# Patient Record
Sex: Male | Born: 1995 | Race: Black or African American | Hispanic: No | Marital: Single | Smoking: Current some day smoker
Health system: Southern US, Community
[De-identification: ages and names within clinical notes are randomized; demographics above are authoritative.]

---

## 2016-03-16 ENCOUNTER — Emergency Department (HOSPITAL_COMMUNITY)
Admission: EM | Admit: 2016-03-16 | Discharge: 2016-03-16 | Disposition: A | Discharge status: Home / Self Care | Payer: Self-pay | Attending: Emergency Medicine | Admitting: Emergency Medicine

## 2016-03-16 ENCOUNTER — Encounter (HOSPITAL_COMMUNITY): Payer: Self-pay

## 2016-03-16 ENCOUNTER — Emergency Department (HOSPITAL_COMMUNITY): Payer: Self-pay

## 2016-03-16 DIAGNOSIS — R0789 Other chest pain: Secondary | ICD-10-CM | POA: Insufficient documentation

## 2016-03-16 DIAGNOSIS — F17210 Nicotine dependence, cigarettes, uncomplicated: Secondary | ICD-10-CM | POA: Insufficient documentation

## 2016-03-16 DIAGNOSIS — J069 Acute upper respiratory infection, unspecified: Secondary | ICD-10-CM | POA: Insufficient documentation

## 2016-03-16 LAB — I-STAT TROPONIN, ED: Troponin i, poc: 0 ng/mL (ref 0.00–0.08)

## 2016-03-16 MED ORDER — BENZONATATE 100 MG PO CAPS
100.0000 mg | ORAL_CAPSULE | Freq: Once | ORAL | Status: AC
  Administered 2016-03-16: 100 mg via ORAL
  Filled 2016-03-16: qty 1

## 2016-03-16 MED ORDER — IBUPROFEN 800 MG PO TABS: 800.0000 mg | ORAL_TABLET | Freq: Three times a day (TID) | ORAL | Status: AC

## 2016-03-16 MED ORDER — BENZONATATE 100 MG PO CAPS: 100.0000 mg | ORAL_CAPSULE | Freq: Three times a day (TID) | ORAL | Status: AC | PRN

## 2016-03-16 MED ORDER — FLUTICASONE PROPIONATE 50 MCG/ACT NA SUSP: 2.0000 | Freq: Every day | NASAL | Status: AC

## 2016-03-16 MED ORDER — IBUPROFEN 800 MG PO TABS
800.0000 mg | ORAL_TABLET | Freq: Once | ORAL | Status: AC
  Administered 2016-03-16: 800 mg via ORAL
  Filled 2016-03-16: qty 1

## 2016-03-16 NOTE — ED Provider Notes (Signed)
CSN: [id number]     Arrival date & time 03/16/16  1354 History  By signing my name below, I, Soijett Blue, attest that this documentation has been prepared under the direction and in the presence of Cheri Fowler, PA-C Electronically Signed: Soijett Blue, ED Scribe. 03/16/2016. 2:56 PM.   Chief Complaint  Patient presents with  . Nasal Congestion  . Cough      The history is provided by the patient. No language interpreter was used.    Joel Diaz is a 20 y.o. male who presents to the Emergency Department complaining of nasal congestion onset 3 days. Denies sick contacts. He states that he is having associated symptoms of generalized left sided sharp CP with cough and neck movement, productive cough, chest tightness, chest congestion, post-nasal drip, subjective fever, SOB with movement only, and sore throat. He states that he has tried benadryl for the relief for his symptoms. He denies leg swelling, n/v, abdominal pain, diarrhea, ear pain, and any other symptoms. Denies recent surgery or PMHx of DVT/PE. Pt states that he smokes 4 cigarettes a week. He reports he does not have CP if sitting or not moving.  His CP is not exertional.  No family hx of SCD.    History reviewed. No pertinent past medical history. History reviewed. No pertinent past surgical history. History reviewed. No pertinent family history. Social History  Substance Use Topics  . Smoking status: Current Some Day Smoker -- 0.00 packs/day    Types: Cigarettes  . Smokeless tobacco: None  . Alcohol Use: No    Review of Systems  Constitutional: Positive for fever (subjective).  HENT: Positive for congestion, postnasal drip and sore throat. Negative for ear pain.   Respiratory: Positive for cough, chest tightness and shortness of breath.   Cardiovascular: Positive for chest pain. Negative for leg swelling.  Gastrointestinal: Negative for nausea, vomiting, abdominal pain and diarrhea.  All other systems reviewed and are  negative.   Allergies  Review of patient's allergies indicates not on file.  Home Medications   Prior to Admission medications   Not on File   BP 125/80 mmHg  Pulse 81  Temp(Src) 99.1 F (37.3 C) (Oral)  Resp 16  Ht  (1.803 m)  Wt 77.111 kg  BMI 23.72 kg/m2  SpO2 100% Physical Exam  Constitutional: He is oriented to person, place, and time. He appears well-developed and well-nourished.  Non-toxic appearance. He does not have a sickly appearance. He does not appear ill.  HENT:  Head: Normocephalic and atraumatic.  Right Ear: Tympanic membrane normal.  Left Ear: Tympanic membrane normal.  Nose: Nose normal.  Mouth/Throat: Uvula is midline, oropharynx is clear and moist and mucous membranes are normal. No oropharyngeal exudate, posterior oropharyngeal edema, posterior oropharyngeal erythema or tonsillar abscesses.  Eyes: Conjunctivae are normal. Pupils are equal, round, and reactive to light.  Neck: Normal range of motion. Neck supple. No JVD present.  Cardiovascular: Normal rate, regular rhythm and normal heart sounds.   No murmur heard. No unilateral LE edema  Pulmonary/Chest: Effort normal and breath sounds normal. No accessory muscle usage or stridor. No respiratory distress. He has no wheezes. He has no rhonchi. He has no rales. He exhibits tenderness.  Chest pain reproducible with palpation of sternum and left anterior chest.   Abdominal: Soft. Bowel sounds are normal. He exhibits no distension. There is no tenderness.  Musculoskeletal: Normal range of motion.  Lymphadenopathy:    He has no cervical adenopathy.  Neurological: He is  alert and oriented to person, place, and time.  Speech clear without dysarthria.  Skin: Skin is warm and dry.  Psychiatric: He has a normal mood and affect. His behavior is normal.  Nursing note and vitals reviewed.   ED Course  Procedures (including critical care time) DIAGNOSTIC STUDIES: Oxygen Saturation is 100% on RA, nl by my  interpretation.    COORDINATION OF CARE: 2:56 PM Discussed treatment plan with pt at bedside which includes CXR, EKG, labs, ibuprofen, tessalon perles and pt agreed to plan.    Labs Review Labs Reviewed  Rosezena Sensor-STAT TROPOININ, ED    Imaging Review Dg Chest 2 View  03/16/2016  CLINICAL DATA:  Nasal and chest congestion.  Cough EXAM: CHEST  2 VIEW COMPARISON:  None FINDINGS: The heart size and mediastinal contours are within normal limits. Both lungs are clear. The visualized skeletal structures are unremarkable. IMPRESSION: No active cardiopulmonary disease. Electronically Signed   By: Signa Kellaylor  Stroud M.D.   On: 03/16/2016 15:58   I have personally reviewed and evaluated these images and lab results as part of my medical decision-making.   EKG Interpretation None     ED ECG REPORT   Date: 03/16/2016  Rate: 78   Rhythm: normal sinus rhythm  QRS Axis: normal  Intervals: normal  ST/T Wave abnormalities: early repolarization  Conduction Disutrbances:none  Narrative Interpretation:   Old EKG Reviewed: none available  I have personally reviewed the EKG tracing and agree with the computerized printout as noted.  MDM   Final diagnoses:  Atypical chest pain  URI (upper respiratory infection)    Patient is to be discharged with recommendation to follow up with PCP in regards to today's hospital visit. LIkely URI.  Plan to discharge home with ibuprofen, flonase, and tessalon perles.  Chest pain is not likely of cardiac or pulmonary etiology d/t presentation, perc negative, VSS, no tracheal deviation, no JVD or new murmur, RRR, breath sounds equal bilaterally, EKG without acute abnormalities, negative troponin, and negative CXR.  CP has been constant x 3 days, negative troponin today, single troponin sufficient.  HEART score 2.  Return to the ED is CP becomes exertional, associated with diaphoresis or nausea, radiates to left jaw/arm, worsens or becomes concerning in any way. Pt appears  reliable for follow up and is agreeable to discharge.   I personally performed the services described in this documentation, which was scribed in my presence. The recorded information has been reviewed and is accurate.    Cheri FowlerKayla Loza Prell, PA-C 03/16/16 1610  Mancel BaleElliott Wentz, MD 03/17/16 1054

## 2016-03-16 NOTE — ED Notes (Signed)
Pt c/o nasal/chest congestion, cough, and chest tightness x 3 days.  Pain score 7/10.  Pt reports that chest pain increases w/ movement and coughing.  Pt reports taking OTC medication w/o relief.

## 2016-03-16 NOTE — Discharge Instructions (Signed)
Upper Respiratory Infection, Adult Most upper respiratory infections (URIs) are a viral infection of the air passages leading to the lungs. A URI affects the nose, throat, and upper air passages. The most common type of URI is nasopharyngitis and is typically referred to as "the common cold." URIs run their course and usually go away on their own. Most of the time, a URI does not require medical attention, but sometimes a bacterial infection in the upper airways can follow a viral infection. This is called a secondary infection. Sinus and middle ear infections are common types of secondary upper respiratory infections. Bacterial pneumonia can also complicate a URI. A URI can worsen asthma and chronic obstructive pulmonary disease (COPD). Sometimes, these complications can require emergency medical care and may be life threatening.  CAUSES Almost all URIs are caused by viruses. A virus is a type of germ and can spread from one person to another.  RISKS FACTORS You may be at risk for a URI if:   You smoke.   You have chronic heart or lung disease.  You have a weakened defense (immune) system.   You are very young or very old.   You have nasal allergies or asthma.  You work in crowded or poorly ventilated areas.  You work in health care facilities or schools. SIGNS AND SYMPTOMS  Symptoms typically develop 2-3 days after you come in contact with a cold virus. Most viral URIs last 7-10 days. However, viral URIs from the influenza virus (flu virus) can last 14-18 days and are typically more severe. Symptoms may include:   Runny or stuffy (congested) nose.   Sneezing.   Cough.   Sore throat.   Headache.   Fatigue.   Fever.   Loss of appetite.   Pain in your forehead, behind your eyes, and over your cheekbones (sinus pain).  Muscle aches.  DIAGNOSIS  Your health care provider may diagnose a URI by:  Physical exam.  Tests to check that your symptoms are not due to  another condition such as:  Strep throat.  Sinusitis.  Pneumonia.  Asthma. TREATMENT  A URI goes away on its own with time. It cannot be cured with medicines, but medicines may be prescribed or recommended to relieve symptoms. Medicines may help:  Reduce your fever.  Reduce your cough.  Relieve nasal congestion. HOME CARE INSTRUCTIONS   Take medicines only as directed by your health care provider.   Gargle warm saltwater or take cough drops to comfort your throat as directed by your health care provider.  Use a warm mist humidifier or inhale steam from a shower to increase air moisture. This may make it easier to breathe.  Drink enough fluid to keep your urine clear or pale yellow.   Eat soups and other clear broths and maintain good nutrition.   Rest as needed.   Return to work when your temperature has returned to normal or as your health care provider advises. You may need to stay home longer to avoid infecting others. You can also use a face mask and careful hand washing to prevent spread of the virus.  Increase the usage of your inhaler if you have asthma.   Do not use any tobacco products, including cigarettes, chewing tobacco, or electronic cigarettes. If you need help quitting, ask your health care provider. PREVENTION  The best way to protect yourself from getting a cold is to practice good hygiene.   Avoid oral or hand contact with people with cold   symptoms.   Wash your hands often if contact occurs.  There is no clear evidence that vitamin C, vitamin E, echinacea, or exercise reduces the chance of developing a cold. However, it is always recommended to get plenty of rest, exercise, and practice good nutrition.  SEEK MEDICAL CARE IF:   You are getting worse rather than better.   Your symptoms are not controlled by medicine.   You have chills.  You have worsening shortness of breath.  You have brown or red mucus.  You have yellow or brown nasal  discharge.  You have pain in your face, especially when you bend forward.  You have a fever.  You have swollen neck glands.  You have pain while swallowing.  You have white areas in the back of your throat. SEEK IMMEDIATE MEDICAL CARE IF:   You have severe or persistent:  Headache.  Ear pain.  Sinus pain.  Chest pain.  You have chronic lung disease and any of the following:  Wheezing.  Prolonged cough.  Coughing up blood.  A change in your usual mucus.  You have a stiff neck.  You have changes in your:  Vision.  Hearing.  Thinking.  Mood. MAKE SURE YOU:   Understand these instructions.  Will watch your condition.  Will get help right away if you are not doing well or get worse.   This information is not intended to replace advice given to you by your health care provider. Make sure you discuss any questions you have with your health care provider.   Document Released: 04/21/2001 Document Revised: 03/12/2015 Document Reviewed: 01/31/2014 Else[employer] Interactive Patient Education 2016 [employer]  

## 2017-11-01 IMAGING — CR DG CHEST 2V
2 series · 2 of 2 positions shown · non-contrast
Comparison: None

CLINICAL DATA: Nasal and chest congestion.  Cough

EXAM:
CHEST  2 VIEW

[w chest pa]
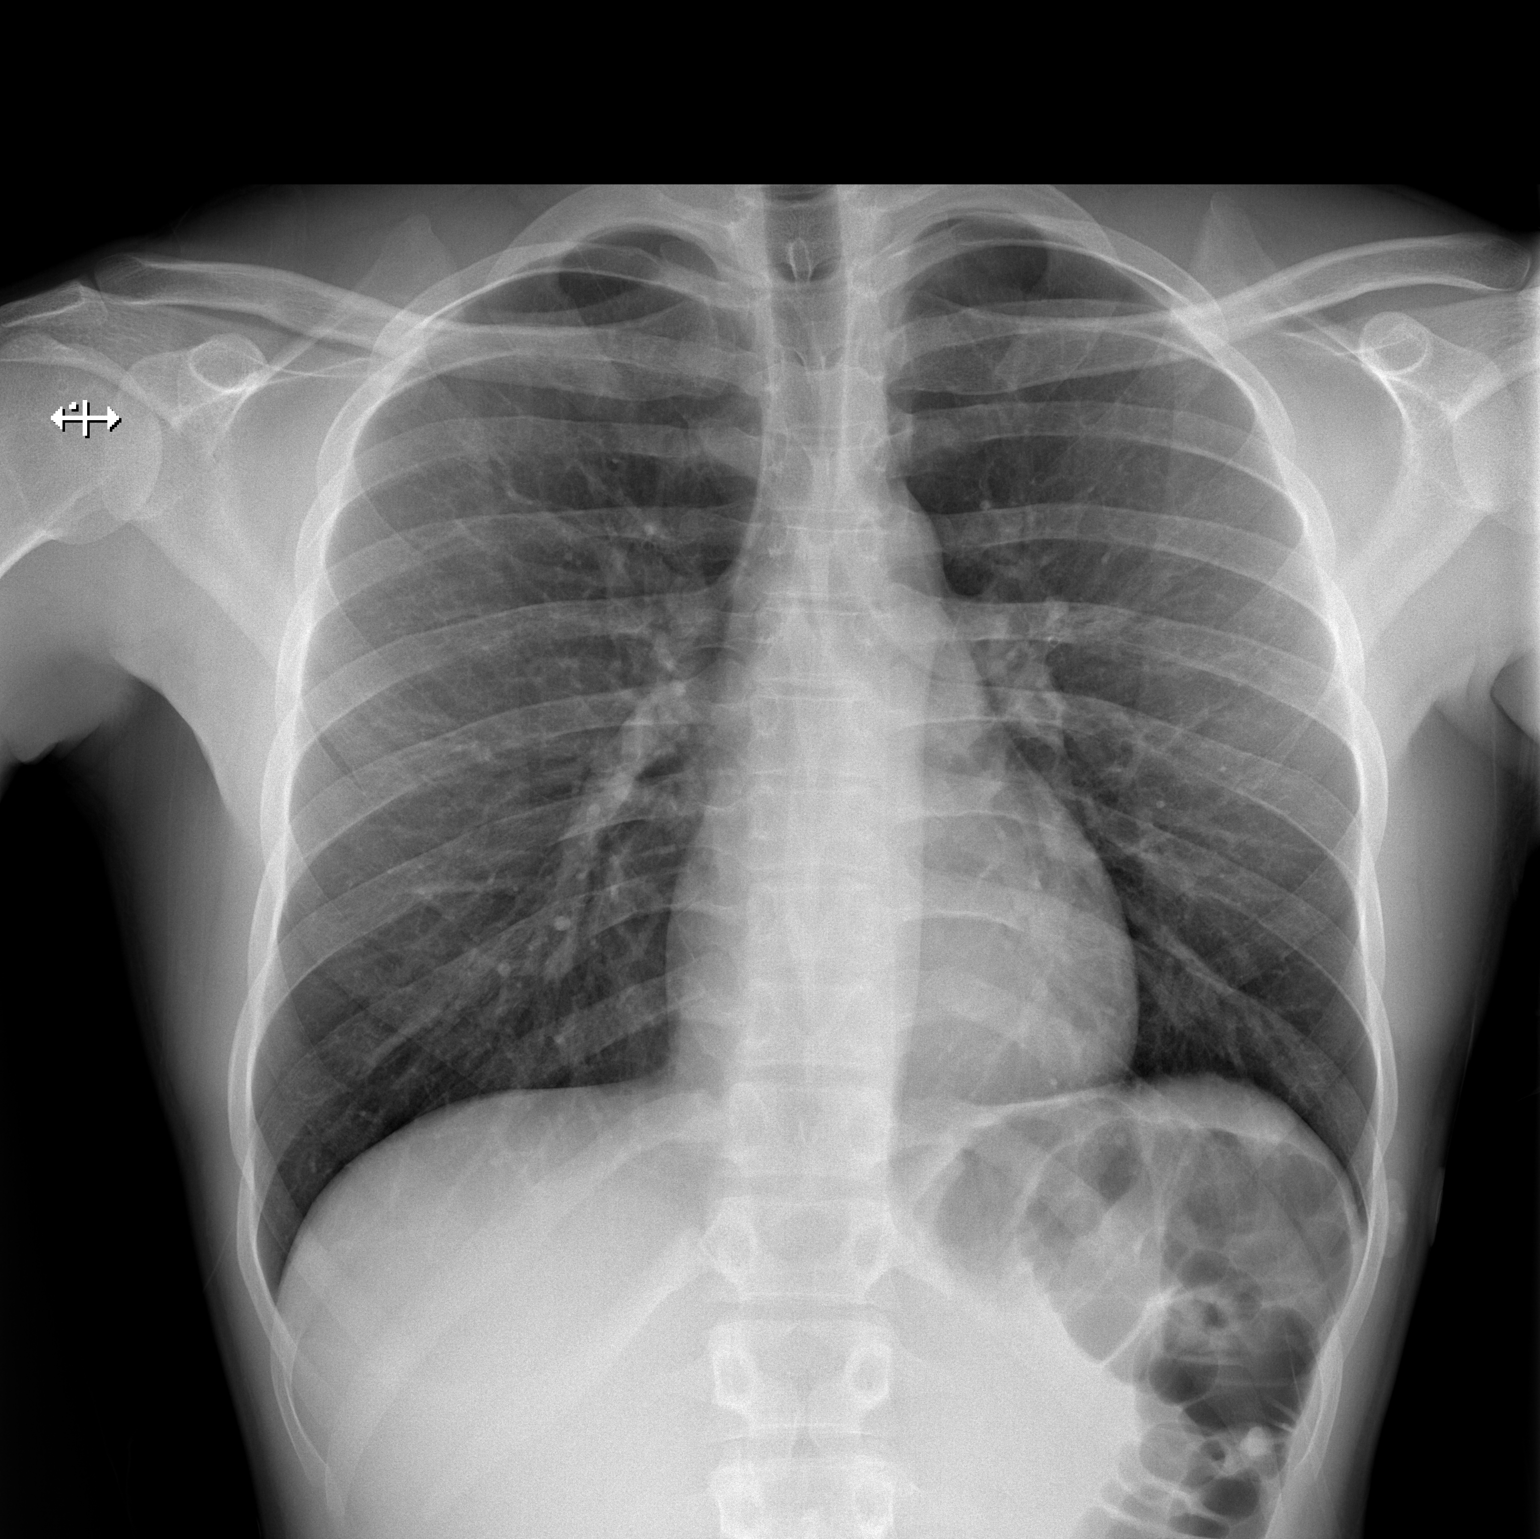

[w chest lat]
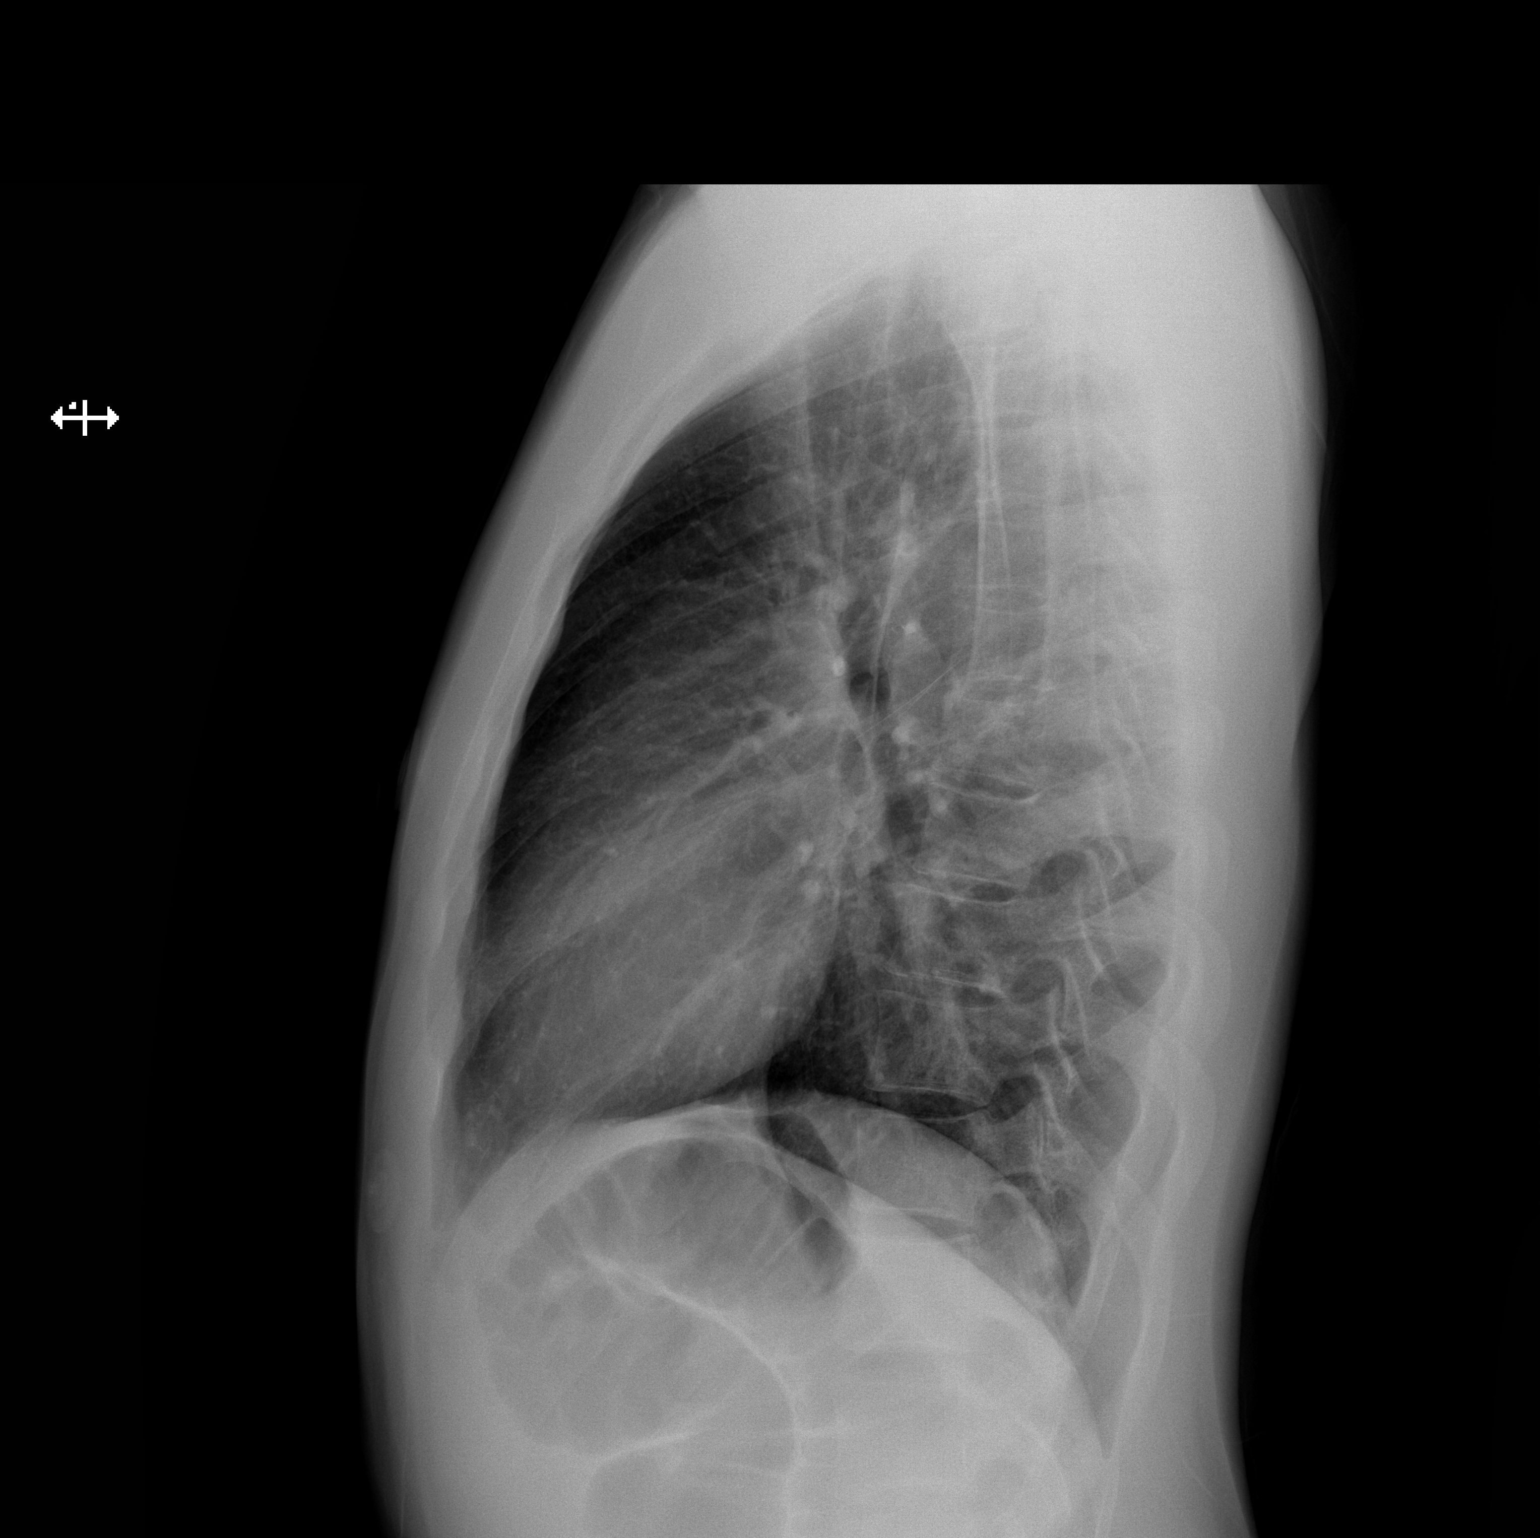

[2 of 2 positions shown; findings below may reference images not displayed]

FINDINGS: The heart size and mediastinal contours are within normal limits.
Both lungs are clear. The visualized skeletal structures are
unremarkable.
IMPRESSION: No active cardiopulmonary disease.
